# Patient Record
Sex: Male | Born: 1990 | Race: White | Hispanic: No | Marital: Single | Smoking: Current every day smoker
Health system: Southern US, Community
[De-identification: ages and names within clinical notes are randomized; demographics above are authoritative.]

## PROBLEM LIST (undated history)

## (undated) ENCOUNTER — Emergency Department (HOSPITAL_COMMUNITY): Admission: EM | Payer: No Typology Code available for payment source | Source: Home / Self Care

---
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
## 2011-07-07 ENCOUNTER — Emergency Department (HOSPITAL_COMMUNITY): Payer: Medicaid Other

## 2011-07-07 ENCOUNTER — Emergency Department (HOSPITAL_COMMUNITY)
Admission: EM | Admit: 2011-07-07 | Discharge: 2011-07-07 | Disposition: A | Discharge status: Home / Self Care | Payer: Medicaid Other | Attending: Emergency Medicine | Admitting: Emergency Medicine

## 2011-07-07 ENCOUNTER — Encounter (HOSPITAL_COMMUNITY): Payer: Self-pay | Admitting: *Deleted

## 2011-07-07 DIAGNOSIS — S6710XA Crushing injury of unspecified finger(s), initial encounter: Secondary | ICD-10-CM | POA: Insufficient documentation

## 2011-07-07 DIAGNOSIS — J45909 Unspecified asthma, uncomplicated: Secondary | ICD-10-CM | POA: Insufficient documentation

## 2011-07-07 DIAGNOSIS — T07XXXA Unspecified multiple injuries, initial encounter: Secondary | ICD-10-CM

## 2011-07-07 DIAGNOSIS — Y9289 Other specified places as the place of occurrence of the external cause: Secondary | ICD-10-CM | POA: Insufficient documentation

## 2011-07-07 DIAGNOSIS — W230XXA Caught, crushed, jammed, or pinched between moving objects, initial encounter: Secondary | ICD-10-CM | POA: Insufficient documentation

## 2011-07-07 MED ORDER — TETANUS-DIPHTH-ACELL PERTUSSIS 5-2.5-18.5 LF-MCG/0.5 IM SUSP: 0.5000 mL | Freq: Once | INTRAMUSCULAR | Status: DC

## 2011-07-07 MED ORDER — IBUPROFEN 800 MG PO TABS: 800.0000 mg | ORAL_TABLET | Freq: Three times a day (TID) | ORAL | Status: AC

## 2011-07-07 MED ORDER — IBUPROFEN 800 MG PO TABS
800.0000 mg | ORAL_TABLET | Freq: Once | ORAL | Status: AC
  Administered 2011-07-07: 800 mg via ORAL
  Filled 2011-07-07: qty 1

## 2011-07-07 MED ORDER — DOXYCYCLINE HYCLATE 100 MG PO TABS
100.0000 mg | ORAL_TABLET | Freq: Once | ORAL | Status: AC
  Administered 2011-07-07: 100 mg via ORAL
  Filled 2011-07-07: qty 1

## 2011-07-07 MED ORDER — DOXYCYCLINE HYCLATE 100 MG PO CAPS: 100.0000 mg | ORAL_CAPSULE | Freq: Two times a day (BID) | ORAL | Status: AC

## 2011-07-07 MED ORDER — TETANUS-DIPHTHERIA TOXOIDS TD 5-2 LFU IM INJ
0.5000 mL | INJECTION | Freq: Once | INTRAMUSCULAR | Status: AC
  Administered 2011-07-07: 0.5 mL via INTRAMUSCULAR
  Filled 2011-07-07: qty 0.5
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
## 2011-07-07 NOTE — ED Notes (Addendum)
Pt smashed his pointer, middle, ring, and pinky fingers on his right hand yesterday.

## 2011-07-07 NOTE — ED Provider Notes (Signed)
History/physical exam/procedure(s) were performed by non-physician practitioner and as supervising physician I was immediately available for consultation/collaboration. I have reviewed all notes and am in agreement with care and plan.   Hilario Quarry, MD 07/07/11 [phone]

## 2012-09-07 ENCOUNTER — Emergency Department (HOSPITAL_COMMUNITY): Payer: Medicaid Other

## 2012-09-07 ENCOUNTER — Encounter (HOSPITAL_COMMUNITY): Payer: Self-pay | Admitting: Emergency Medicine

## 2012-09-07 ENCOUNTER — Emergency Department (HOSPITAL_COMMUNITY)
Admission: EM | Admit: 2012-09-07 | Discharge: 2012-09-07 | Disposition: A | Discharge status: Home / Self Care | Payer: Medicaid Other | Attending: Emergency Medicine | Admitting: Emergency Medicine

## 2012-09-07 DIAGNOSIS — R109 Unspecified abdominal pain: Secondary | ICD-10-CM | POA: Insufficient documentation

## 2012-09-07 DIAGNOSIS — F17200 Nicotine dependence, unspecified, uncomplicated: Secondary | ICD-10-CM | POA: Insufficient documentation

## 2012-09-07 DIAGNOSIS — N23 Unspecified renal colic: Secondary | ICD-10-CM

## 2012-09-07 DIAGNOSIS — J45909 Unspecified asthma, uncomplicated: Secondary | ICD-10-CM | POA: Insufficient documentation

## 2012-09-07 LAB — URINALYSIS, MICROSCOPIC ONLY
Glucose, UA: NEGATIVE mg/dL
Ketones, ur: NEGATIVE mg/dL
Leukocytes, UA: NEGATIVE
Specific Gravity, Urine: 1.027 (ref 1.005–1.030)
pH: 5.5 (ref 5.0–8.0)

## 2012-09-07 LAB — CBC WITH DIFFERENTIAL/PLATELET
Eosinophils Relative: 1 % (ref 0–5)
HCT: 40.8 % (ref 39.0–52.0)
Hemoglobin: 14.7 g/dL (ref 13.0–17.0)
Lymphocytes Relative: 42 % (ref 12–46)
Lymphs Abs: 4.3 10*3/uL — ABNORMAL HIGH (ref 0.7–4.0)
MCV: 85.5 fL (ref 78.0–100.0)
Monocytes Absolute: 0.8 10*3/uL (ref 0.1–1.0)
RBC: 4.77 MIL/uL (ref 4.22–5.81)
WBC: 10.4 10*3/uL (ref 4.0–10.5)

## 2012-09-07 LAB — COMPREHENSIVE METABOLIC PANEL
Albumin: 4.5 g/dL (ref 3.5–5.2)
BUN: 13 mg/dL (ref 6–23)
Calcium: 9.6 mg/dL (ref 8.4–10.5)
Creatinine, Ser: 1.1 mg/dL (ref 0.50–1.35)
Potassium: 4 mEq/L (ref 3.5–5.1)
Total Protein: 8 g/dL (ref 6.0–8.3)

## 2012-09-07 LAB — LIPASE, BLOOD: Lipase: 27 U/L (ref 11–59)

## 2012-09-07 MED ORDER — ONDANSETRON HCL 4 MG/2ML IJ SOLN
4.0000 mg | Freq: Once | INTRAMUSCULAR | Status: AC
  Administered 2012-09-07: 4 mg via INTRAVENOUS
  Filled 2012-09-07: qty 2

## 2012-09-07 MED ORDER — MORPHINE SULFATE 4 MG/ML IJ SOLN
4.0000 mg | Freq: Once | INTRAMUSCULAR | Status: AC
  Administered 2012-09-07: 4 mg via INTRAVENOUS
  Filled 2012-09-07: qty 1

## 2012-09-07 MED ORDER — HYDROCODONE-ACETAMINOPHEN 5-325 MG PO TABS
2.0000 | ORAL_TABLET | Freq: Once | ORAL | Status: AC
  Administered 2012-09-07: 2 via ORAL
  Filled 2012-09-07: qty 2

## 2012-09-07 MED ORDER — IBUPROFEN 800 MG PO TABS: 800.0000 mg | ORAL_TABLET | Freq: Three times a day (TID) | ORAL | Status: DC

## 2012-09-07 MED ORDER — ONDANSETRON HCL 4 MG PO TABS: 4.0000 mg | ORAL_TABLET | Freq: Four times a day (QID) | ORAL | Status: DC

## 2012-09-07 MED ORDER — ONDANSETRON 4 MG PO TBDP
4.0000 mg | ORAL_TABLET | Freq: Once | ORAL | Status: AC
  Administered 2012-09-07: 4 mg via ORAL
  Filled 2012-09-07: qty 1

## 2012-09-07 MED ORDER — SODIUM CHLORIDE 0.9 % IV BOLUS (SEPSIS)
1000.0000 mL | Freq: Once | INTRAVENOUS | Status: AC
  Administered 2012-09-07: 1000 mL via INTRAVENOUS

## 2012-09-07 MED ORDER — OXYCODONE-ACETAMINOPHEN 5-325 MG PO TABS: 2.0000 | ORAL_TABLET | ORAL | Status: DC | PRN

## 2012-09-07 NOTE — ED Provider Notes (Signed)
History     CSN: [id number]  Arrival date & time 09/07/12  1442   First MD Initiated Contact with Patient 09/07/12 1459      Chief Complaint  Patient presents with  . Flank Pain    (Consider location/radiation/quality/duration/timing/severity/associated sxs/prior treatment) HPI Comments: Patient complains of right lower quadrant pain and right flank pain that has been constant for the past 3 days. Wife states had this pain intermittently for several months it comes and goes. His been constant and worse with palpation and movement. Denies any nausea, vomiting, change in bowel or bladder habits. No dysuria no hematuria, no testicular pain. No history of kidney stones. Denies any back, chest or abdominal pain. Pain is worse with lifting.  The history is provided by the patient.    Past Medical History  Diagnosis Date  . Asthma     History reviewed. No pertinent past surgical history.  History reviewed. No pertinent family history.  History  Substance Use Topics  . Smoking status: Current Every Day Smoker  . Smokeless tobacco: Not on file  . Alcohol Use: No      Review of Systems  Constitutional: Negative for fever, activity change and appetite change.  HENT: Negative for congestion and rhinorrhea.   Respiratory: Negative for chest tightness.   Cardiovascular: Negative for chest pain.  Gastrointestinal: Positive for abdominal pain. Negative for nausea and vomiting.  Genitourinary: Positive for flank pain. Negative for testicular pain.  Musculoskeletal: Negative for back pain.  Skin: Negative for rash.  Neurological: Negative for dizziness and headaches.  A complete 10 system review of systems was obtained and all systems are negative except as noted in the HPI and PMH.    Allergies  Amoxicillin and Penicillins  Home Medications  No current outpatient prescriptions on file.  BP 140/71  Pulse 116  Temp(Src) 98 F (36.7 C) (Oral)  Resp 16  SpO2 100%  Physical  Exam  Constitutional: He is oriented to person, place, and time. He appears well-developed and well-nourished. No distress.  HENT:  Head: Normocephalic and atraumatic.  Mouth/Throat: Oropharynx is clear and moist. No oropharyngeal exudate.  Eyes: Conjunctivae and EOM are normal. Pupils are equal, round, and reactive to light.  Neck: Normal range of motion. Neck supple.  Cardiovascular: Normal rate, regular rhythm and normal heart sounds.   Pulmonary/Chest: Effort normal and breath sounds normal. No respiratory distress.  Abdominal: Soft. There is tenderness. There is no rebound and no guarding.  TTP RLQ with guarding. No rebound.  Genitourinary:  No testicular pain  Musculoskeletal: Normal range of motion. He exhibits no edema and no tenderness.  No CVAT  Neurological: He is alert and oriented to person, place, and time. No cranial nerve deficit. He exhibits normal muscle tone. Coordination normal.  Skin: Skin is warm.    ED Course  Procedures (including critical care time)  Labs Reviewed  CBC WITH DIFFERENTIAL - Abnormal; Notable for the following:    Lymphs Abs 4.3 (*)    All other components within normal limits  URINALYSIS, MICROSCOPIC ONLY - Abnormal; Notable for the following:    Hgb urine dipstick MODERATE (*)    Bilirubin Urine SMALL (*)    All other components within normal limits  COMPREHENSIVE METABOLIC PANEL  LIPASE, BLOOD   Ct Abdomen Pelvis Wo Contrast  09/07/2012  *RADIOLOGY REPORT*  Clinical Data: Right flank pain for 2 days  CT ABDOMEN AND PELVIS WITHOUT CONTRAST  Technique:  Multidetector CT imaging of the abdomen and pelvis  was performed following the standard protocol without intravenous contrast.  Comparison: None.  Findings: The lung bases are clear.  The liver is unremarkable in the unenhanced state.  No calcified gallstones are seen although there may be sludge layering within the gallbladder.  The pancreas is normal in size and the pancreatic duct is not  dilated.  The adrenal glands and spleen are unremarkable.  The stomach is decompressed.  There are several small right renal calculi present. However no hydronephrosis is seen.  The abdominal aorta is normal in caliber.  No adenopathy is noted.  The distal ureters are normal in caliber and no distal ureteral calculus is noted at.  The urinary bladder is not well distended but no abnormality is seen.  The prostate is normal in size.  The appendix and the terminal ileum are unremarkable.  No bony abnormality is seen.  IMPRESSION:  1.  No present hydronephrosis. 2.  Several small right renal calculi which are nonobstructing.   Original Report Authenticated By: Dwyane Dee, M.D.      No diagnosis found.    MDM  Right lower quadrant pain for the past 3 days it is constant. No associated symptoms.  Concern for appendicitis versus kidney stone.  Hematuria and UA without infection.  CT scan shows several renal stones but no obstruction or hydronephrosis. Normal appendix. Question recently passed stone.  Pain improved medications. We'll treat for renal colic. Followup with urology. Return precautions discussed.      Glynn Octave, MD 09/07/12 1840

## 2012-09-07 NOTE — ED Notes (Signed)
Pt c/o right flank pain x 2 days; pt denies other sx

## 2013-01-23 ENCOUNTER — Encounter (HOSPITAL_COMMUNITY): Payer: Self-pay | Admitting: Emergency Medicine

## 2013-01-23 ENCOUNTER — Emergency Department (HOSPITAL_COMMUNITY)
Admission: EM | Admit: 2013-01-23 | Discharge: 2013-01-23 | Disposition: A | Discharge status: Home / Self Care | Payer: Medicaid Other | Attending: Emergency Medicine | Admitting: Emergency Medicine

## 2013-01-23 ENCOUNTER — Emergency Department (HOSPITAL_COMMUNITY): Payer: Medicaid Other

## 2013-01-23 DIAGNOSIS — R630 Anorexia: Secondary | ICD-10-CM | POA: Insufficient documentation

## 2013-01-23 DIAGNOSIS — Z880 Allergy status to penicillin: Secondary | ICD-10-CM | POA: Insufficient documentation

## 2013-01-23 DIAGNOSIS — R319 Hematuria, unspecified: Secondary | ICD-10-CM | POA: Insufficient documentation

## 2013-01-23 DIAGNOSIS — R300 Dysuria: Secondary | ICD-10-CM | POA: Insufficient documentation

## 2013-01-23 DIAGNOSIS — R109 Unspecified abdominal pain: Secondary | ICD-10-CM | POA: Insufficient documentation

## 2013-01-23 DIAGNOSIS — F17200 Nicotine dependence, unspecified, uncomplicated: Secondary | ICD-10-CM | POA: Insufficient documentation

## 2013-01-23 DIAGNOSIS — R51 Headache: Secondary | ICD-10-CM | POA: Insufficient documentation

## 2013-01-23 DIAGNOSIS — R110 Nausea: Secondary | ICD-10-CM | POA: Insufficient documentation

## 2013-01-23 DIAGNOSIS — J45909 Unspecified asthma, uncomplicated: Secondary | ICD-10-CM | POA: Insufficient documentation

## 2013-01-23 DIAGNOSIS — K089 Disorder of teeth and supporting structures, unspecified: Secondary | ICD-10-CM | POA: Insufficient documentation

## 2013-01-23 LAB — CBC WITH DIFFERENTIAL/PLATELET
Basophils Absolute: 0 10*3/uL (ref 0.0–0.1)
Basophils Relative: 0 % (ref 0–1)
Eosinophils Absolute: 0.1 10*3/uL (ref 0.0–0.7)
Eosinophils Relative: 1 % (ref 0–5)
HCT: 41.2 % (ref 39.0–52.0)
Hemoglobin: 14.7 g/dL (ref 13.0–17.0)
MCH: 31.5 pg (ref 26.0–34.0)
MCHC: 35.7 g/dL (ref 30.0–36.0)
MCV: 88.4 fL (ref 78.0–100.0)
Monocytes Absolute: 1.3 10*3/uL — ABNORMAL HIGH (ref 0.1–1.0)
Monocytes Relative: 9 % (ref 3–12)
Neutro Abs: 10 10*3/uL — ABNORMAL HIGH (ref 1.7–7.7)
RDW: 12.6 % (ref 11.5–15.5)

## 2013-01-23 LAB — POCT I-STAT, CHEM 8
BUN: 8 mg/dL (ref 6–23)
Calcium, Ion: 1.18 mmol/L (ref 1.12–1.23)
Chloride: 106 mEq/L (ref 96–112)
Creatinine, Ser: 1.2 mg/dL (ref 0.50–1.35)
Sodium: 143 mEq/L (ref 135–145)

## 2013-01-23 LAB — URINALYSIS, ROUTINE W REFLEX MICROSCOPIC
Bilirubin Urine: NEGATIVE
Glucose, UA: NEGATIVE mg/dL
Ketones, ur: NEGATIVE mg/dL
Leukocytes, UA: NEGATIVE
Protein, ur: NEGATIVE mg/dL

## 2013-01-23 MED ORDER — HYDROMORPHONE HCL PF 1 MG/ML IJ SOLN
1.0000 mg | Freq: Once | INTRAMUSCULAR | Status: AC
  Administered 2013-01-23: 1 mg via INTRAMUSCULAR
  Filled 2013-01-23: qty 1

## 2013-01-23 MED ORDER — ONDANSETRON HCL 4 MG/2ML IJ SOLN
4.0000 mg | Freq: Once | INTRAMUSCULAR | Status: AC
  Administered 2013-01-23: 4 mg via INTRAVENOUS
  Filled 2013-01-23: qty 2

## 2013-01-23 MED ORDER — HYDROCODONE-ACETAMINOPHEN 5-325 MG PO TABS: 1.0000 | ORAL_TABLET | Freq: Four times a day (QID) | ORAL | Status: DC | PRN

## 2013-01-23 MED ORDER — IOHEXOL 300 MG/ML  SOLN
25.0000 mL | INTRAMUSCULAR | Status: DC | PRN
  Administered 2013-01-23: 25 mL via ORAL

## 2013-01-23 MED ORDER — KETOROLAC TROMETHAMINE 30 MG/ML IJ SOLN
30.0000 mg | Freq: Once | INTRAMUSCULAR | Status: AC
  Administered 2013-01-23: 30 mg via INTRAVENOUS
  Filled 2013-01-23: qty 1

## 2013-01-23 MED ORDER — IOHEXOL 300 MG/ML  SOLN
100.0000 mL | Freq: Once | INTRAMUSCULAR | Status: AC | PRN
  Administered 2013-01-23: 100 mL via INTRAVENOUS

## 2013-01-23 NOTE — ED Notes (Signed)
Patient resting in bed, talking on phone to family member. Call bell within reach.  Wife at bedside. Patient denies any other needs as this time. Vital signs stable. Will continue to monitor patient.

## 2013-01-23 NOTE — ED Notes (Signed)
Patient is resting comfortably in bed watching TV. Wife at bedside attentive to patient.  Patient denies any needs at this time. Vital signs stable. Will continue to monitor.

## 2013-01-23 NOTE — ED Notes (Signed)
Patient discharge instruction discussed. Medications reviewed. Discussed follow-up care and given resources. Vital signs stable. Patient denies any other questions. Discharged to home with wife.

## 2013-01-23 NOTE — ED Notes (Signed)
Patient transported to X-ray 

## 2013-01-23 NOTE — ED Provider Notes (Signed)
15:00:  Care of pt assumed from [place], [place], see her note for full history and details of initial workup.  Briefly, pt is a 22 y.o. M presenting with 2 weeks of R flank pain and nausea.  Pt was seen in ED when symptoms began where he had CT stone study showing stones in kidney, none obstructing.  Pt has had continued pain so returned today.  He has mild leukocytosis, otherwise normal labs, U/A, and CXR.  CT A/P pending to r/o appendicitis.  Plan for d/c home if normal.  CT A/P negative for appendicitis or other acute intra-abdominal pathology.  Non-obstructing R renal calculi noted.   Discussed results with pt.  Advised close f/u with PCP if pain persists.  ED return precautions given including worsening pain, fever, vomiting, or any other concerns.  Pt states understanding, stable at discharge.  Discussed with attending Dr. Micheline Maze.   CT Abdomen Pelvis W Contrast (Final result)  Result time: 01/23/13 17:39:35    Final result by Rad Results In Interface (01/23/13 17:39:35)    Narrative:   *RADIOLOGY REPORT*  Clinical Data: Right lower quadrant pain question appendicitis  CT ABDOMEN AND PELVIS WITH CONTRAST  Technique: Multidetector CT imaging of the abdomen and pelvis was performed following the standard protocol during bolus administration of intravenous contrast. Sagittal and coronal MPR images reconstructed from axial data set.  Contrast: OMNIPAQUE IOHEXOL 300 MG/ML SOLN Dilute oral contrast.  Comparison: 09/07/2012  Findings: Lung bases clear. 2 tiny nonobstructing right renal calculi. No hydronephrosis ureteral dilatation. Liver, spleen, pancreas, kidneys, and adrenal glands otherwise normal appearance. Normal appendix.  Bladder and ureters unremarkable. Stomach and bowel loops normal appearance. No mass, adenopathy, free fluid or inflammatory process. Bones unremarkable.  IMPRESSION: Nonobstructing right renal calculi. No acute intra-abdominal or intrapelvic  abnormalities. Specifically no evidence of acute appendicitis.     Jodean Lima, MD 01/24/13 [phone]

## 2013-01-23 NOTE — ED Notes (Signed)
Pt reports right flank pain radiates to right back ongoing for months. Pt reports dark urine that is red in color. Pt also c/o lower mouth pain and headache onset today. Pt reports nausea.

## 2013-01-24 NOTE — ED Provider Notes (Signed)
Medical screening examination/treatment/procedure(s) were conducted as a shared visit with resident-physician practitioner(s) and myself.  I personally evaluated the patient during the encounter.  Pt is a 22 y.o. male with pmhx as above presenting with 2 weeks R flank pain, nausea.  Pt found to have non-obstructing stones in R kidney, no other intraabdominal pathology.  Pt safe to f/u as outpt.     Shanna Cisco, MD 01/24/13 [phone]

## 2013-02-19 NOTE — ED Provider Notes (Signed)
CSN: [id number]     Arrival date & time 01/23/13  1205 History   First MD Initiated Contact with Patient 01/23/13 1218     Chief Complaint  Patient presents with  . Abdominal Pain  . Dental Pain   (Consider location/radiation/quality/duration/timing/severity/associated sxs/prior Treatment) HPI Comments: Patient is 22 year old male who presents to the Ed with complaints of right flank pain, nausea and headache over the past 24 hours - reports this is worsening as the time goes on.  Reports decrease in appetite and has noticed dark red urine as well.  Denies vomiting, constipation, diarrhea, fever or chills.  Patient is a 22 y.o. male presenting with abdominal pain and tooth pain. The history is provided by the patient. No language interpreter was used.  Abdominal Pain Pain location:  R flank Pain quality: dull and gnawing   Pain quality: not aching, not burning, no pressure, not sharp, not shooting, not stabbing and not throbbing   Pain radiates to:  Does not radiate Pain severity:  Moderate Onset quality:  Gradual Duration:  24 hours Timing:  Constant Progression:  Worsening Chronicity:  New Context: not diet changes, not eating, not medication withdrawal, not retching and not sick contacts   Relieved by:  Nothing Worsened by:  Nothing tried Ineffective treatments:  None tried Associated symptoms: anorexia, dysuria, hematuria and nausea   Associated symptoms: no chest pain, no cough, no diarrhea, no fever, no melena and no vomiting   Dental Pain Associated symptoms: no fever     Past Medical History  Diagnosis Date  . Asthma    History reviewed. No pertinent past surgical history. No family history on file. History  Substance Use Topics  . Smoking status: Current Every Day Smoker  . Smokeless tobacco: Not on file  . Alcohol Use: No    Review of Systems  Constitutional: Negative for fever.  Respiratory: Negative for cough.   Cardiovascular: Negative for chest pain.   Gastrointestinal: Positive for nausea, abdominal pain and anorexia. Negative for vomiting, diarrhea and melena.  Genitourinary: Positive for dysuria and hematuria.  All other systems reviewed and are negative.    Allergies  Amoxicillin and Penicillins  Home Medications   Current Outpatient Rx  Name  Route  Sig  Dispense  Refill  . ibuprofen (ADVIL,MOTRIN) 800 MG tablet   Oral   Take 1 tablet (800 mg total) by mouth 3 (three) times daily.   21 tablet   0   . HYDROcodone-acetaminophen (NORCO/VICODIN) 5-325 MG per tablet   Oral   Take 1 tablet by mouth every 6 (six) hours as needed for pain.   10 tablet   0    BP 124/63  Pulse 64  Temp(Src) 98.1 F (36.7 C) (Oral)  Resp 16  Ht 6' (1.829 m)  Wt 180 lb (81.647 kg)  BMI 24.41 kg/m2  SpO2 100% Physical Exam  Nursing note and vitals reviewed. Constitutional: He is oriented to person, place, and time. He appears well-developed and well-nourished. No distress.  HENT:  Head: Normocephalic and atraumatic.  Right Ear: External ear normal.  Left Ear: External ear normal.  Nose: Nose normal.  Mouth/Throat: Oropharynx is clear and moist. No oropharyngeal exudate.  Eyes: Conjunctivae are normal. Pupils are equal, round, and reactive to light. No scleral icterus.  Neck: Normal range of motion. Neck supple.  Cardiovascular: Normal rate, regular rhythm and normal heart sounds.  Exam reveals no gallop and no friction rub.   No murmur heard. Pulmonary/Chest: Effort normal  and breath sounds normal. No respiratory distress. He has no wheezes. He has no rales. He exhibits no tenderness.  Abdominal: Soft. Bowel sounds are normal. He exhibits no distension. There is no tenderness. There is CVA tenderness. There is no rebound and no guarding.  Genitourinary: Penis normal. Right testis shows no mass and no tenderness. Left testis shows no mass and no tenderness. Circumcised.  Musculoskeletal: Normal range of motion. He exhibits no edema and  no tenderness.  Neurological: He is alert and oriented to person, place, and time. No cranial nerve deficit. He exhibits normal muscle tone. Coordination normal.  Skin: Skin is warm and dry. No rash noted. No erythema. No pallor.  Psychiatric: He has a normal mood and affect. His behavior is normal. Judgment and thought content normal.    ED Course  Procedures (including critical care time) Labs Review Labs Reviewed  CBC WITH DIFFERENTIAL - Abnormal; Notable for the following:    WBC 13.9 (*)    Neutro Abs 10.0 (*)    Monocytes Absolute 1.3 (*)    All other components within normal limits  URINALYSIS, ROUTINE W REFLEX MICROSCOPIC  POCT I-STAT, CHEM 8   Imaging Review No results found.  Results for orders placed during the hospital encounter of 01/23/13  URINALYSIS, ROUTINE W REFLEX MICROSCOPIC      Result Value Range   Color, Urine YELLOW  YELLOW   APPearance CLEAR  CLEAR   Specific Gravity, Urine 1.022  1.005 - 1.030   pH 6.5  5.0 - 8.0   Glucose, UA NEGATIVE  NEGATIVE mg/dL   Hgb urine dipstick NEGATIVE  NEGATIVE   Bilirubin Urine NEGATIVE  NEGATIVE   Ketones, ur NEGATIVE  NEGATIVE mg/dL   Protein, ur NEGATIVE  NEGATIVE mg/dL   Urobilinogen, UA 1.0  0.0 - 1.0 mg/dL   Nitrite NEGATIVE  NEGATIVE   Leukocytes, UA NEGATIVE  NEGATIVE  CBC WITH DIFFERENTIAL      Result Value Range   WBC 13.9 (*) 4.0 - 10.5 K/uL   RBC 4.66  4.22 - 5.81 MIL/uL   Hemoglobin 14.7  13.0 - 17.0 g/dL   HCT 16.1  09.6 - 04.5 %   MCV 88.4  78.0 - 100.0 fL   MCH 31.5  26.0 - 34.0 pg   MCHC 35.7  30.0 - 36.0 g/dL   RDW 40.9  81.1 - 91.4 %   Platelets 258  150 - 400 K/uL   Neutrophils Relative % 73  43 - 77 %   Neutro Abs 10.0 (*) 1.7 - 7.7 K/uL   Lymphocytes Relative 18  12 - 46 %   Lymphs Abs 2.4  0.7 - 4.0 K/uL   Monocytes Relative 9  3 - 12 %   Monocytes Absolute 1.3 (*) 0.1 - 1.0 K/uL   Eosinophils Relative 1  0 - 5 %   Eosinophils Absolute 0.1  0.0 - 0.7 K/uL   Basophils Relative 0  0 - 1  %   Basophils Absolute 0.0  0.0 - 0.1 K/uL  POCT I-STAT, CHEM 8      Result Value Range   Sodium 143  135 - 145 mEq/L   Potassium 3.9  3.5 - 5.1 mEq/L   Chloride 106  96 - 112 mEq/L   BUN 8  6 - 23 mg/dL   Creatinine, Ser 7.82  0.50 - 1.35 mg/dL   Glucose, Bld 94  70 - 99 mg/dL   Calcium, Ion 9.56  2.13 - 1.23 mmol/L  TCO2 26  0 - 100 mmol/L   Hemoglobin 15.6  13.0 - 17.0 g/dL   HCT 16.1  09.6 - 04.5 %   Dg Chest 2 View  01/23/2013   *RADIOLOGY REPORT*  Clinical Data: Abdominal pain.  Dental pain.  CHEST - 2 VIEW  Comparison: Two-view chest x-ray 07/27/2007.  Findings: Cardiomediastinal silhouette unremarkable, unchanged. Lungs clear.  Bronchovascular markings normal.  Pulmonary vascularity normal.  No pneumothorax.  No pleural effusions. Interval resolution of the opacities in the right middle and lower lobes identified on the prior examination.  Visualized bony thorax intact.  IMPRESSION: Normal examination.   Original Report Authenticated By: Hulan Saas, M.D.   Ct Abdomen Pelvis W Contrast  01/23/2013   *RADIOLOGY REPORT*  Clinical Data: Right lower quadrant pain question appendicitis  CT ABDOMEN AND PELVIS WITH CONTRAST  Technique:  Multidetector CT imaging of the abdomen and pelvis was performed following the standard protocol during bolus administration of intravenous contrast. Sagittal and coronal MPR images reconstructed from axial data set.  Contrast: OMNIPAQUE IOHEXOL 300 MG/ML  SOLN Dilute oral contrast.  Comparison: 09/07/2012  Findings: Lung bases clear. 2 tiny nonobstructing right renal calculi. No hydronephrosis ureteral dilatation. Liver, spleen, pancreas, kidneys, and adrenal glands otherwise normal appearance. Normal appendix.  Bladder and ureters unremarkable. Stomach and bowel loops normal appearance. No mass, adenopathy, free fluid or inflammatory process. Bones unremarkable.  IMPRESSION: Nonobstructing right renal calculi. No acute intra-abdominal or intrapelvic  abnormalities. Specifically no evidence of acute appendicitis.   Original Report Authenticated By: Ulyses Southward, M.D.      MDM   1. Abdominal  pain, other specified site    Patient here with right flank/lower abdominal pain and nausea - plan to get CT Scan to rule out appendicitis.  Though he does have urinary symptoms, I doubt kidney stone at this time.  Patient turned over to the afternoon resident.  Please see her notes for disposition and follow up.    Izola Price Marisue [place], [place] 02/19/13 [phone]

## 2013-02-19 NOTE — ED Provider Notes (Signed)
Medical screening examination/treatment/procedure(s) were performed by non-physician practitioner and as supervising physician I was immediately available for consultation/collaboration.    Gilda Crease, MD 02/19/13 [phone]

## 2013-07-08 ENCOUNTER — Emergency Department (HOSPITAL_COMMUNITY): Payer: No Typology Code available for payment source

## 2013-07-08 ENCOUNTER — Encounter (HOSPITAL_COMMUNITY): Payer: Self-pay | Admitting: Emergency Medicine

## 2013-07-08 ENCOUNTER — Emergency Department (HOSPITAL_COMMUNITY)
Admission: EM | Admit: 2013-07-08 | Discharge: 2013-07-09 | Disposition: A | Discharge status: Home / Self Care | Payer: No Typology Code available for payment source | Attending: Emergency Medicine | Admitting: Emergency Medicine

## 2013-07-08 DIAGNOSIS — S139XXA Sprain of joints and ligaments of unspecified parts of neck, initial encounter: Secondary | ICD-10-CM | POA: Insufficient documentation

## 2013-07-08 DIAGNOSIS — Z880 Allergy status to penicillin: Secondary | ICD-10-CM | POA: Insufficient documentation

## 2013-07-08 DIAGNOSIS — Y9389 Activity, other specified: Secondary | ICD-10-CM | POA: Insufficient documentation

## 2013-07-08 DIAGNOSIS — Y92410 Unspecified street and highway as the place of occurrence of the external cause: Secondary | ICD-10-CM | POA: Insufficient documentation

## 2013-07-08 DIAGNOSIS — F17200 Nicotine dependence, unspecified, uncomplicated: Secondary | ICD-10-CM | POA: Insufficient documentation

## 2013-07-08 DIAGNOSIS — R071 Chest pain on breathing: Secondary | ICD-10-CM | POA: Insufficient documentation

## 2013-07-08 DIAGNOSIS — S161XXA Strain of muscle, fascia and tendon at neck level, initial encounter: Secondary | ICD-10-CM

## 2013-07-08 DIAGNOSIS — J45909 Unspecified asthma, uncomplicated: Secondary | ICD-10-CM | POA: Insufficient documentation

## 2013-07-08 DIAGNOSIS — R079 Chest pain, unspecified: Secondary | ICD-10-CM | POA: Insufficient documentation

## 2013-07-08 DIAGNOSIS — R51 Headache: Secondary | ICD-10-CM | POA: Insufficient documentation

## 2013-07-08 DIAGNOSIS — M25519 Pain in unspecified shoulder: Secondary | ICD-10-CM | POA: Insufficient documentation
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
## 2013-07-08 NOTE — ED Notes (Signed)
Pt currently in radiology. Called radiology, spoke with Francis DowseJoel [facility].
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
## 2013-07-09 MED ORDER — METHOCARBAMOL 500 MG PO TABS: 500.0000 mg | ORAL_TABLET | Freq: Two times a day (BID) | ORAL | Status: AC

## 2013-07-09 MED ORDER — HYDROCODONE-ACETAMINOPHEN 5-325 MG PO TABS: 1.0000 | ORAL_TABLET | ORAL | Status: AC | PRN

## 2013-07-09 NOTE — Discharge Instructions (Signed)
1. Medications: robaxin, vicodin, usual home medications 2. Treatment: rest, drink plenty of fluids, use heat, gentle stretching as discussed 3. Follow Up: Please followup with your primary doctor for discussion of your diagnoses and further evaluation after today's visit; if you do not have a primary care doctor use the resource guide provided to find one;     Cervical Sprain A cervical sprain is an injury in the neck in which the strong, fibrous tissues (ligaments) that connect your neck bones stretch or tear. Cervical sprains can range from mild to severe. Severe cervical sprains can cause the neck vertebrae to be unstable. This can lead to damage of the spinal cord and can result in serious nervous system problems. The amount of time it takes for a cervical sprain to get better depends on the cause and extent of the injury. Most cervical sprains heal in 1 to 3 weeks. CAUSES  Severe cervical sprains may be caused by:   Contact sport injuries (such as from football, rugby, wrestling, hockey, auto racing, gymnastics, diving, martial arts, or boxing).   Motor vehicle collisions.   Whiplash injuries. This is an injury from a sudden forward-and backward whipping movement of the head and neck.  Falls.  Mild cervical sprains may be caused by:   Being in an awkward position, such as while cradling a telephone between your ear and shoulder.   Sitting in a chair that does not offer proper support.   Working at a poorly Marketing executive station.   Looking up or down for long periods of time.  SYMPTOMS   Pain, soreness, stiffness, or a burning sensation in the front, back, or sides of the neck. This discomfort may develop immediately after the injury or slowly, 24 hours or more after the injury.   Pain or tenderness directly in the middle of the back of the neck.   Shoulder or upper back pain.   Limited ability to move the neck.   Headache.   Dizziness.   Weakness,  numbness, or tingling in the hands or arms.   Muscle spasms.   Difficulty swallowing or chewing.   Tenderness and swelling of the neck.  DIAGNOSIS  Most of the time your health care provider can diagnose a cervical sprain by taking your history and doing a physical exam. Your health care provider will ask about previous neck injuries and any known neck problems, such as arthritis in the neck. X-rays may be taken to find out if there are any other problems, such as with the bones of the neck. Other tests, such as a CT scan or MRI, may also be needed.  TREATMENT  Treatment depends on the severity of the cervical sprain. Mild sprains can be treated with rest, keeping the neck in place (immobilization), and pain medicines. Severe cervical sprains are immediately immobilized. Further treatment is done to help with pain, muscle spasms, and other symptoms and may include:  Medicines, such as pain relievers, numbing medicines, or muscle relaxants.   Physical therapy. This may involve stretching exercises, strengthening exercises, and posture training. Exercises and improved posture can help stabilize the neck, strengthen muscles, and help stop symptoms from returning.  HOME CARE INSTRUCTIONS   Put ice on the injured area.   Put ice in a plastic bag.   Place a towel between your skin and the bag.   Leave the ice on for 15 20 minutes, 3 4 times a day.   If your injury was severe, you may have been  given a cervical collar to wear. A cervical collar is a two-piece collar designed to keep your neck from moving while it heals.  Do not remove the collar unless instructed by your health care provider.  If you have long hair, keep it outside of the collar.  Ask your health care provider before making any adjustments to your collar. Minor adjustments may be required over time to improve comfort and reduce pressure on your chin or on the back of your head.  Ifyou are allowed to remove the  collar for cleaning or bathing, follow your health care provider's instructions on how to do so safely.  Keep your collar clean by wiping it with mild soap and water and drying it completely. If the collar you have been given includes removable pads, remove them every 1 2 days and hand wash them with soap and water. Allow them to air dry. They should be completely dry before you wear them in the collar.  If you are allowed to remove the collar for cleaning and bathing, wash and dry the skin of your neck. Check your skin for irritation or sores. If you see any, tell your health care provider.  Do not drive while wearing the collar.   Only take over-the-counter or prescription medicines for pain, discomfort, or fever as directed by your health care provider.   Keep all follow-up appointments as directed by your health care provider.   Keep all physical therapy appointments as directed by your health care provider.   Make any needed adjustments to your workstation to promote good posture.   Avoid positions and activities that make your symptoms worse.   Warm up and stretch before being active to help prevent problems.  SEEK MEDICAL CARE IF:   Your pain is not controlled with medicine.   You are unable to decrease your pain medicine over time as planned.   Your activity level is not improving as expected.  SEEK IMMEDIATE MEDICAL CARE IF:   You develop any bleeding.  You develop stomach upset.  You have signs of an allergic reaction to your medicine.   Your symptoms get worse.   You develop new, unexplained symptoms.   You have numbness, tingling, weakness, or paralysis in any part of your body.  MAKE SURE YOU:   Understand these instructions.  Will watch your condition.  Will get help right away if you are not doing well or get worse. Document Released: 03/17/2007 Document Revised: 03/10/2013 Document Reviewed: 11/25/2012 [facility] Patient Information 2014  [place], [place]. Back Exercises Back exercises help treat and prevent back injuries. The goal of back exercises is to increase the strength of your abdominal and back muscles and the flexibility of your back. These exercises should be started when you no longer have back pain. Back exercises include:  Pelvic Tilt. Lie on your back with your knees bent. Tilt your pelvis until the lower part of your back is against the floor. Hold this position 5 to 10 sec and repeat 5 to 10 times.  Knee to Chest. Pull first 1 knee up against your chest and hold for 20 to 30 seconds, repeat this with the other knee, and then both knees. This may be done with the other leg straight or bent, whichever feels better.  Sit-Ups or Curl-Ups. Bend your knees 90 degrees. Start with tilting your pelvis, and do a partial, slow sit-up, lifting your trunk only 30 to 45 degrees off the floor. Take at least 2 to 3  seconds for each sit-up. Do not do sit-ups with your knees out straight. If partial sit-ups are difficult, simply do the above but with only tightening your abdominal muscles and holding it as directed.  Hip-Lift. Lie on your back with your knees flexed 90 degrees. Push down with your feet and shoulders as you raise your hips a couple inches off the floor; hold for 10 seconds, repeat 5 to 10 times.  Back arches. Lie on your stomach, propping yourself up on bent elbows. Slowly press on your hands, causing an arch in your low back. Repeat 3 to 5 times. Any initial stiffness and discomfort should lessen with repetition over time.  Shoulder-Lifts. Lie face down with arms beside your body. Keep hips and torso pressed to floor as you slowly lift your head and shoulders off the floor. Do not overdo your exercises, especially in the beginning. Exercises may cause you some mild back discomfort which lasts for a few minutes; however, if the pain is more severe, or lasts for more than 15 minutes, do not continue exercises until you see  your caregiver. Improvement with exercise therapy for back problems is slow.  See your caregivers for assistance with developing a proper back exercise program. Document Released: 06/27/2004 Document Revised: 08/12/2011 Document Reviewed: 03/21/2011 [employer] Patient Information 2014 [place], [place].    Emergency Department Resource Guide 1) Find a Doctor and Pay Out of Pocket Although you won't have to find out who is covered by your insurance plan, it is a good idea to ask around and get recommendations. You will then need to call the office and see if the doctor you have chosen will accept you as a new patient and what types of options they offer for patients who are self-pay. Some doctors offer discounts or will set up payment plans for their patients who do not have insurance, but you will need to ask so you aren't surprised when you get to your appointment.  2) Contact Your Local Health Department Not all health departments have doctors that can see patients for sick visits, but many do, so it is worth a call to see if yours does. If you don't know where your local health department is, you can check in your phone book. The CDC also has a tool to help you locate your state's health department, and many state websites also have listings of all of their local health departments.  3) Find a Walk-in Clinic If your illness is not likely to be very severe or complicated, you may want to try a walk in clinic. These are popping up all over the country in pharmacies, drugstores, and shopping centers. They're usually staffed by nurse practitioners or physician assistants that have been trained to treat common illnesses and complaints. They're usually fairly quick and inexpensive. However, if you have serious medical issues or chronic medical problems, these are probably not your best option.  No Primary Care Doctor: - Call Health Connect at  [phone] - they can help you locate a primary care doctor that   accepts your insurance, provides certain services, etc. - Physician Referral Service- [phone]  Chronic Pain Problems: Organization         Address  Phone   Notes  [facility]  [phone] Patients need to be referred by their primary care doctor.   Medication Assistance: Organization         Address  Phone   Notes  [facility] Medication Assistance Program [phone]  [address] [place], [place] [address] [phone] --Must be a resident of [facility] -- Must have NO insurance coverage whatsoever (no Medicaid/ Medicare, etc.) -- The pt. MUST have a primary care doctor that directs their care regularly and follows them in the community   [facility]ist  [phone]   [employer]  [phone]    Agencies that provide inexpensive medical care: Organization         Address  Phone   Notes  [facility]  [phone]   [facility]    [phone]   [facility] [address] [place], [place] [address] [phone]   [facility] [address] [place] [phone]   Planned Parenthood    [phone]   [facility]    [phone]   [facility]  [address], [place] Phone:  [phone], Fax:  [phone] Hours of Operation:  9 am - 6 pm, M-F.  Also accepts Medicaid/Medicare and self-pay.  [facility]  [address], [place] Phone: [phone], Fax: [phone]. Hours of Operation:  8:30 am - 5:30 pm, M-F.  Also accepts Medicaid and self-pay.  [facility] [place] [address], [place] Phone: [phone]   [facility] [address] Natasha Bence [place], [place] [phone], Ext. [phone] Mondays & Thursdays: 7-9 AM.  First 15 patients are seen on a first come, first serve basis.    Medicaid-accepting [facility] Providers:  Organization          Address  Phone   Notes  [facility] [address], [place] [phone] Also accepts self-pay patients.  [facility] [address] Laurell Josephs [place], [place]  [phone]   [facility] [address], [place] [phone]   [facility] [address], [place] [phone]   Renaye Rakers [address], [place]   [phone] Only accepts [place] patients after they have their name applied to their card.   Self-Pay (no insurance) in [facility]:  Organization         Address  Phone   Notes  Sickle Cell Patients, [facility] [address] [place], [place] [phone]   [facility] [address] [place], [place] [phone]   [facility]  [address],  [phone]   [facility]/Dr. Osei-Bonsu  [address] [place] or [address], [place] [phone] Phone number for both [place] and [place] locations is the same.  [facility] [address] [place] [phone]   [facility] [address] [place] or [address] [phone]   [facility] [address] [place] [phone], phone; [phone], fax Sees patients 1st and 3rd Saturday of every month.  Must not qualify for public or private insurance (i.e. Medicaid, Medicare, [place] Health Choice, Veterans' Benefits)  Household income should be no more than 200% of the poverty level The clinic cannot treat you if you are pregnant or think you are pregnant  Sexually transmitted diseases are not treated at the clinic.  Dental Care: Organization         Address  Phone  Notes  [facility] [address] [place],  [place] [phone] Accepts children up to age 78 who are enrolled in [place] or [place] Health Choice; pregnant women with a Medicaid card; and children who have applied for Medicaid or [place] Health Choice, but were declined, whose parents can pay a reduced fee at time of service.  [facility]  [address], [place] [phone] Accepts children up to age 63 who are enrolled in [place] or [place] Health Choice; pregnant women with a Medicaid card; and children who have applied for Medicaid or [place] Health Choice, but were declined, whose parents can pay a reduced fee at time of service.  [facility]  [address] [place], [place] [phone] Patients are seen by appointment only. Walk-ins are not accepted. [facility] will see patients 59 years of age and older. Monday - Tuesday (8am-5pm) Most Wednesdays (8:30-5pm) $30 per visit, cash only  [facility] Adult Dental Access [facility]  [address], [facility] [phone] Patients are seen by appointment only. Walk-ins are not accepted. [facility] will see patients 6 years of age and older. One Wednesday Evening (Monthly: Volunteer Based).  $30 per visit, cash only  [employer]  [phone] for adults; Children under age 7, call Graduate Pediatric Dentistry at [phone]. Children aged 69-14, please call [phone] to request a pediatric application.  Dental services are provided in all areas of dental care including fillings, crowns and bridges, complete and partial dentures, implants, gum treatment, root canals, and extractions. Preventive care is also provided. Treatment is provided to both adults and children. Patients are selected via a lottery and there is often a waiting list.   [facility] [address], [place]  [phone] [email or web address]   [facility] [address] [place], [place]  [phone], Ext. [phone] Second and Fourth Thursday of each month, opens at 6:30 AM; Clinic ends at 9 AM.  Patients are seen on a first-come first-served basis, and a limited number are seen during each clinic.   [facility]  [address] Ether Griffins [place], [place] [phone]   Eligibility Requirements You must have lived in [place], [place], or [place] counties for at least the last three months.   You cannot be eligible for state or federal sponsored [place], including [employer], [place], or [employer].   You generally cannot be eligible for healthcare insurance through your employer.    How to apply: Eligibility screenings are held every Tuesday and Wednesday afternoon from 1:00 pm until 4:00 pm. You do not need an appointment for the interview!  [facility] [address] [place], [place] [phone]   [facility] Health Department  [phone]   [facility] Health Department  [phone]   [facility] Health Department  [phone]    Behavioral Health Resources in the Community: Intensive Outpatient Programs Organization         Address  Phone  Notes  [facility] [address] [place], [place] [phone]   [facility] Outpatient [address], [place], [place] [phone]   [facility] [address] [place], [place]  [phone]   [facility] [address],  [place], [place] [phone] or [phone]   Substance Abuse Resources Organization         Address  Phone  Notes  [facility]  [phone]   [facility]  [phone]   [facility]  [phone]   [facility]  [phone]   Residential & Outpatient Substance Abuse Program  [phone]   Psychological Services Organization         Address  Phone  Notes  [facility]  [phone]- [phone]   [facility]  [phone]- [phone]    [facility] [address], [place] [phone] or [phone]    Mobile Crisis Teams Organization         Address  Phone  Notes  [facility], Mobile Crisis Care Unit  [phone]   [facility]  [address] [place], [place] [phone]   Doristine LocksSharon DeEsch [address] [place] [phone]    Self-Help/Support Groups Organization         Address  Phone             Notes  [facility] [place] - variety of support groups  336- [phone] Call for more information  Narcotics Anonymous (NA), [facility] [address], [place]  2 meetings at this location   Statisticianesidential Treatment Programs Organization         Address  Phone  Notes  [facility] [address],    [place]  [phone]   [facility]  [address], [place] [phone], [place], [place] [phone]   [facility] [address], [place] [phone] Admissions: 8am-3pm M-F  [facility] [address]    [place], [place] [phone] [phone]   [facility] [address], [place], [place] [phone]   [facility] [address]  [place], [place] [phone]   [facility] - Intensive Outpatient [address], [place], [place] [phone]   [facility] ([facility] [address]  [place], [place] [phone] or [phone]   [facility] ([facility]) [address] [place], [place] [phone] Accepts Medicaid  [facility] [address]  [place] [phone] Substance Abuse/Addiction Treatment   [facility] Resources Organization         Address  Phone  Notes  [facility]  [phone](888) [phone]   Angie FavaJulie Brannon, PhD [address] [place], [place]   [phone]) [phone] or [phone]) [phone]   [facility]   [address] [place], [place] [phone](336) [phone]   [facility] [address], [place], [place] [phone](336) [phone] Insurance/Medicaid/sponsorship through [facility] [address]                                    [place], [place] [phone](336) [phone] Therapy/tele-psych/case  [facility] [address]   [place], [place] [phone](336) [phone]    Dr. Lolly MustacheArfeen  [phone]   [facility]  [employer] [facility] 1) [address], [place] 2) [address], [place] 3)  [address], [place] [phone]) [phone]) [phone]  [phone](336) [phone]   [facility] Child Abuse Hotline [phone](336) [phone] or [phone](336) [phone] (After Hours)

## 2013-07-12 NOTE — ED Provider Notes (Signed)
  Medical screening examination/treatment/procedure(s) were performed by non-physician practitioner and as supervising physician I was immediately available for consultation/collaboration.      Lavonte Palos, MD 07/12/13 0721 

## 2013-09-18 ENCOUNTER — Emergency Department: Payer: Self-pay | Admitting: Emergency Medicine

## 2014-04-02 IMAGING — CR DG CHEST 2V
2 series · 2 of 2 positions shown · non-contrast
Comparison: Two-view chest x-ray 07/27/2007.

CLINICAL DATA: Abdominal pain.  Dental pain.

CHEST - 2 VIEW

[w chest pa]
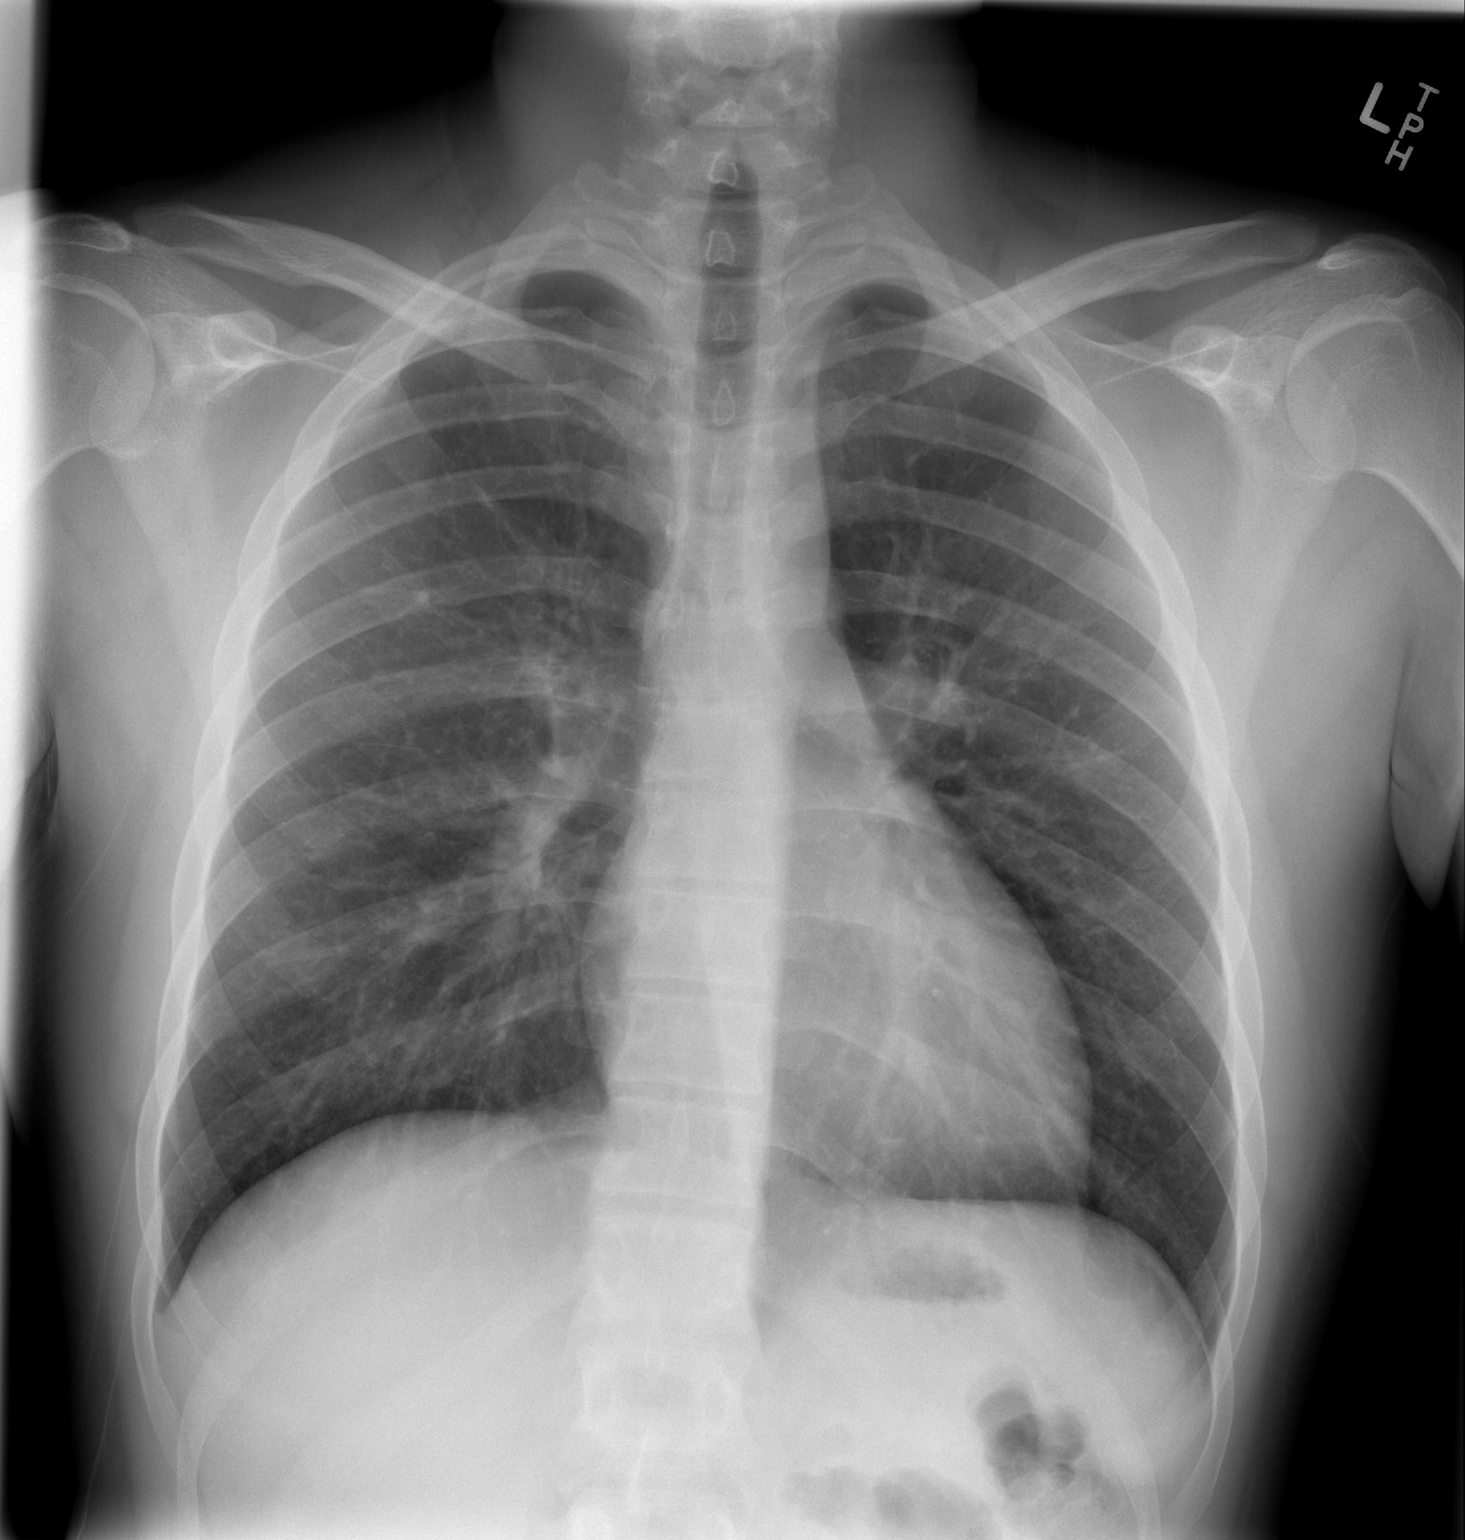

[w chest lat]
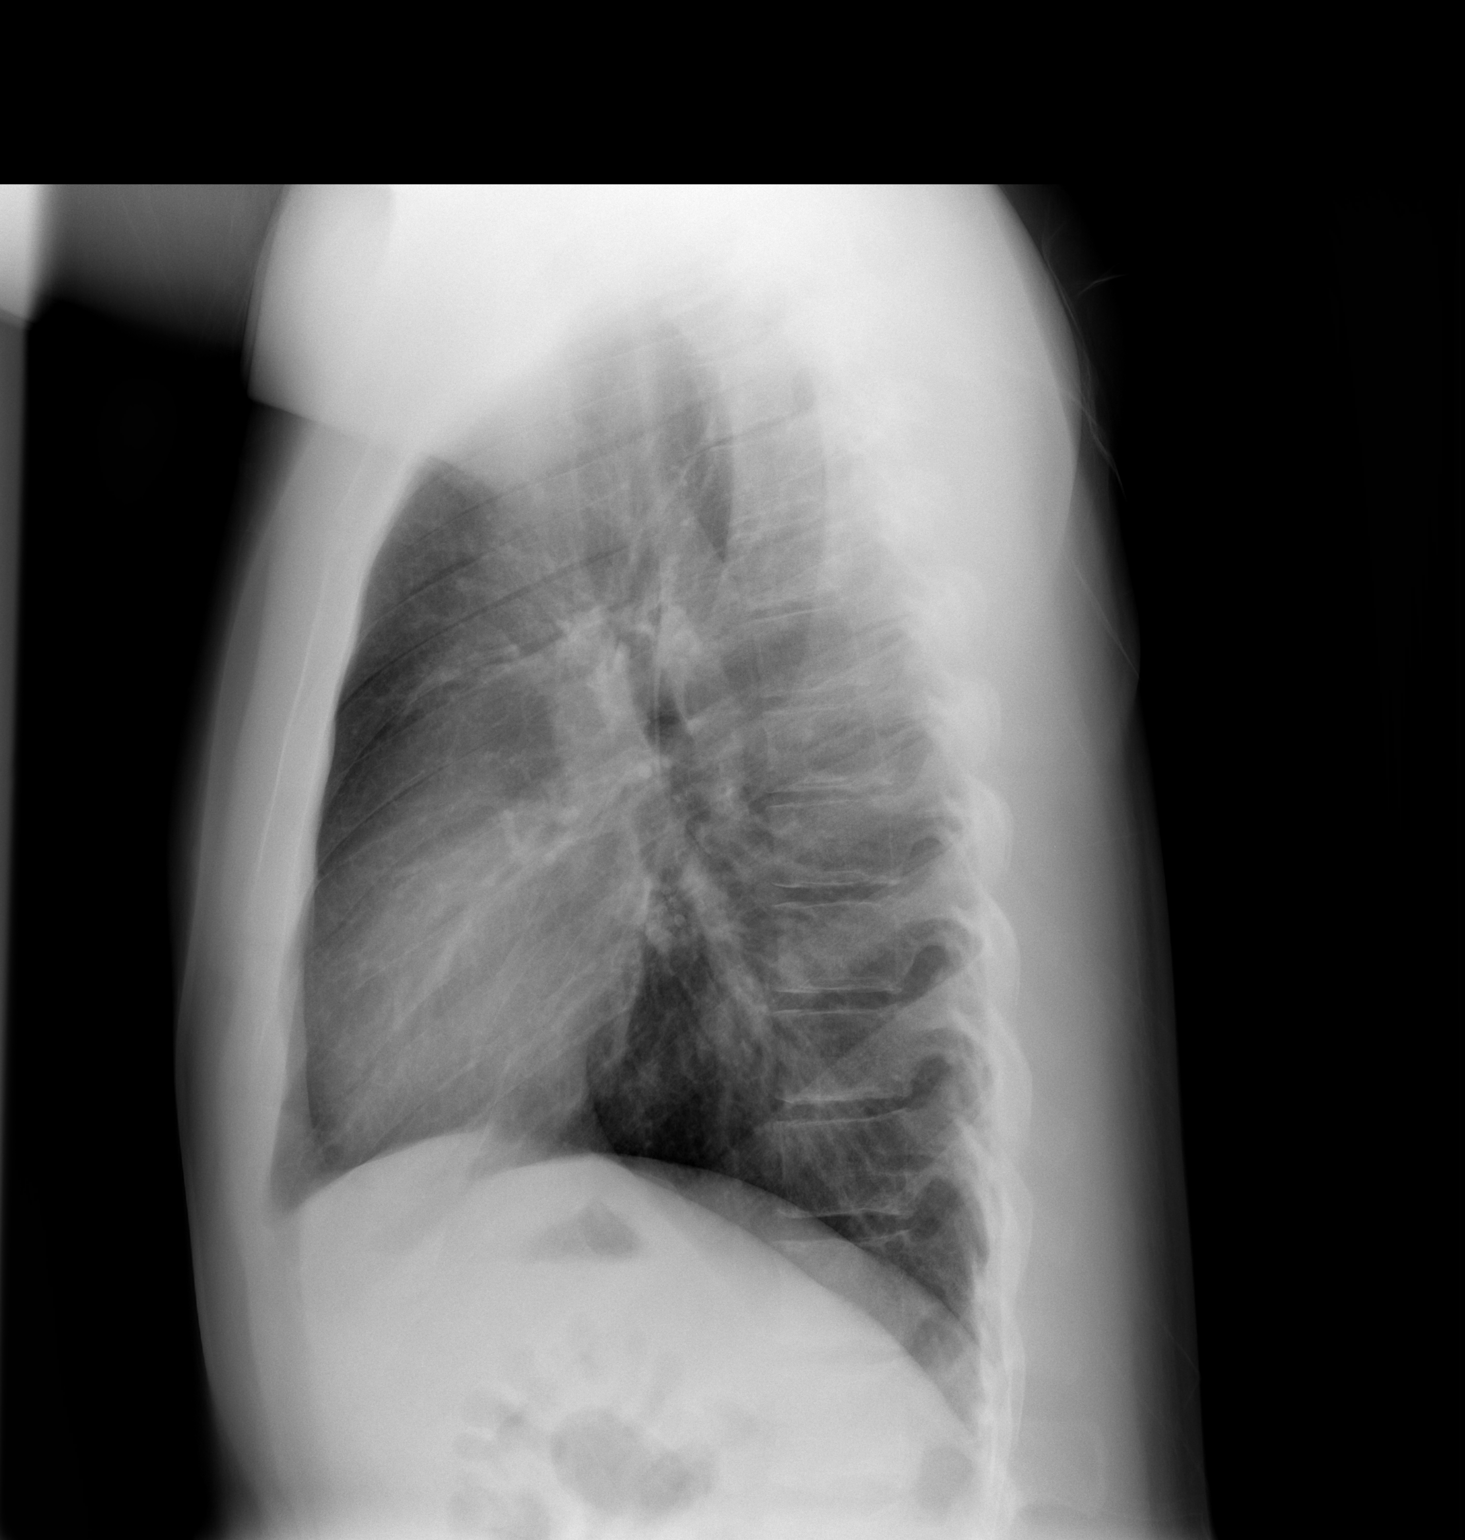

[2 of 2 positions shown; findings below may reference images not displayed]

FINDINGS: Cardiomediastinal silhouette unremarkable, unchanged.
Lungs clear.  Bronchovascular markings normal.  Pulmonary
vascularity normal.  No pneumothorax.  No pleural effusions.
Interval resolution of the opacities in the right middle and lower
lobes identified on the prior examination.  Visualized bony thorax
intact.
IMPRESSION: Normal examination.

## 2014-04-13 DIAGNOSIS — Z79899 Other long term (current) drug therapy: Secondary | ICD-10-CM | POA: Diagnosis not present

## 2014-04-13 DIAGNOSIS — Z880 Allergy status to penicillin: Secondary | ICD-10-CM | POA: Insufficient documentation

## 2014-04-13 DIAGNOSIS — Y990 Civilian activity done for income or pay: Secondary | ICD-10-CM | POA: Insufficient documentation

## 2014-04-13 DIAGNOSIS — T543X1A Toxic effect of corrosive alkalis and alkali-like substances, accidental (unintentional), initial encounter: Secondary | ICD-10-CM | POA: Diagnosis not present

## 2014-04-13 DIAGNOSIS — S0591XA Unspecified injury of right eye and orbit, initial encounter: Secondary | ICD-10-CM | POA: Diagnosis present

## 2014-04-13 DIAGNOSIS — X58XXXA Exposure to other specified factors, initial encounter: Secondary | ICD-10-CM | POA: Insufficient documentation

## 2014-04-13 DIAGNOSIS — J45909 Unspecified asthma, uncomplicated: Secondary | ICD-10-CM | POA: Insufficient documentation

## 2014-04-13 DIAGNOSIS — Z720 Tobacco use: Secondary | ICD-10-CM | POA: Insufficient documentation

## 2014-04-13 DIAGNOSIS — Y9289 Other specified places as the place of occurrence of the external cause: Secondary | ICD-10-CM | POA: Diagnosis not present

## 2014-04-13 DIAGNOSIS — H11421 Conjunctival edema, right eye: Secondary | ICD-10-CM | POA: Diagnosis not present

## 2014-04-13 DIAGNOSIS — Y9389 Activity, other specified: Secondary | ICD-10-CM | POA: Diagnosis not present

## 2014-04-13 DIAGNOSIS — T2661XA Corrosion of cornea and conjunctival sac, right eye, initial encounter: Secondary | ICD-10-CM | POA: Insufficient documentation

## 2014-04-14 ENCOUNTER — Emergency Department (HOSPITAL_COMMUNITY)
Admission: EM | Admit: 2014-04-14 | Discharge: 2014-04-14 | Disposition: A | Discharge status: Home / Self Care | Payer: Worker's Compensation | Attending: Emergency Medicine | Admitting: Emergency Medicine

## 2014-04-14 ENCOUNTER — Encounter (HOSPITAL_COMMUNITY): Payer: Self-pay | Admitting: Emergency Medicine

## 2014-04-14 DIAGNOSIS — T2661XA Corrosion of cornea and conjunctival sac, right eye, initial encounter: Secondary | ICD-10-CM

## 2014-04-14 DIAGNOSIS — T543X1A Toxic effect of corrosive alkalis and alkali-like substances, accidental (unintentional), initial encounter: Secondary | ICD-10-CM

## 2014-04-14 MED ORDER — PROPARACAINE HCL 0.5 % OP SOLN
1.0000 [drp] | Freq: Once | OPHTHALMIC | Status: AC
  Administered 2014-04-14: 1 [drp] via OPHTHALMIC
  Filled 2014-04-14: qty 15

## 2014-04-14 MED ORDER — OXYCODONE-ACETAMINOPHEN 5-325 MG PO TABS: 2.0000 | ORAL_TABLET | ORAL | Status: DC | PRN

## 2014-04-14 MED ORDER — OXYCODONE-ACETAMINOPHEN 5-325 MG PO TABS
2.0000 | ORAL_TABLET | Freq: Once | ORAL | Status: AC
  Administered 2014-04-14: 2 via ORAL
  Filled 2014-04-14: qty 2

## 2014-04-14 MED ORDER — ONDANSETRON 4 MG PO TBDP
4.0000 mg | ORAL_TABLET | Freq: Once | ORAL | Status: AC
  Administered 2014-04-14: 4 mg via ORAL
  Filled 2014-04-14: qty 1

## 2014-04-14 MED ORDER — HYDROMORPHONE HCL 1 MG/ML IJ SOLN
2.0000 mg | Freq: Once | INTRAMUSCULAR | Status: AC
  Administered 2014-04-14: 2 mg via INTRAMUSCULAR
  Filled 2014-04-14: qty 2

## 2014-04-14 NOTE — ED Notes (Signed)
RN remains at bedside flushing pt right eye with normal saline per MD instructions.  After 2 liters of NS morgan lens was inserted and 3rd liter of NS flush initiated.

## 2014-04-14 NOTE — ED Notes (Signed)
Pt. accidentally sprayed chemical  degreaser at right eye this evening at work , presents with right eye redness /drainage and blurred vision .

## 2014-04-14 NOTE — Discharge Instructions (Signed)
Go to Dr. Randon GoldsmithLyles office at 8:30AM.  Chemical Conjunctivitis Chemical conjunctivitis is an irritation of the underside of the eyelid and the white part of the eye. Conjunctivitis can be caused by infection, allergy or chemical irritation. In your case it has been caused by a chemical irritation of the eye. Symptoms almost always include: tearing, light sensitivity, gritty feeling (sensation) in the eyes, swelling of your eyelids, and often severe pain. In spite of the severe pain, this irritation will run its course and will improve within 24 hours.  HOME CARE INSTRUCTIONS   To ease discomfort apply a cool, clean wash cloth to your eye for 10 to 20 minutes, 3 to 4 times per day.  Do not rub your eyes.  Gently wipe away any discharge from the eyes with moistened tissues.  Wash your hands often with soap and use paper towels to dry.  Sunglasses may be helpful if light bothers your eyes.  Do not use eye make-up.  Do not use contact lenses until the irritation is gone.  Do not operate machinery or drive if your vision is blurred.  Take medications as directed by your caregiver. Artificial tears may ease discomfort.  Avoid the chemical or surroundings which caused the problem. Always use eye protection as necessary. SEEK MEDICAL CARE IF:   The eye is still pink (inflamed) 3 days after beginning treatment.  Pain in the eye increases.  You have discharge coming from either eye.  Your eyelids are stuck together in the morning.  You have an increased sensitivity to light.  An oral temperature above 102 F (38.9 C) develops.  You develop facial pain.  You have any problems that may be related to the medicine you are taking. SEEK IMMEDIATE MEDICAL CARE IF:   Your vision is getting worse.  You develop severe eye pain. MAKE SURE YOU:   Understand these instructions.  Will watch your condition.  Will get help right away if you are not doing well or get worse. Document Released:  02/27/2005 Document Revised: 08/12/2011 Document Reviewed: 01/06/2008 [facility] Patient Information 2015 [employer] [place]. This information is not intended to replace advice given to you by your health care provider. Make sure you discuss any questions you have with your health care provider.
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
## 2014-04-16 ENCOUNTER — Emergency Department (HOSPITAL_COMMUNITY)
Admission: EM | Admit: 2014-04-16 | Discharge: 2014-04-16 | Disposition: A | Discharge status: Home / Self Care | Payer: Worker's Compensation | Attending: Emergency Medicine | Admitting: Emergency Medicine

## 2014-04-16 ENCOUNTER — Encounter (HOSPITAL_COMMUNITY): Payer: Self-pay

## 2014-04-16 DIAGNOSIS — T2611XD Burn of cornea and conjunctival sac, right eye, subsequent encounter: Secondary | ICD-10-CM | POA: Diagnosis present

## 2014-04-16 DIAGNOSIS — Y998 Other external cause status: Secondary | ICD-10-CM | POA: Insufficient documentation

## 2014-04-16 DIAGNOSIS — Z880 Allergy status to penicillin: Secondary | ICD-10-CM | POA: Diagnosis not present

## 2014-04-16 DIAGNOSIS — Y9289 Other specified places as the place of occurrence of the external cause: Secondary | ICD-10-CM | POA: Insufficient documentation

## 2014-04-16 DIAGNOSIS — X12XXXD Contact with other hot fluids, subsequent encounter: Secondary | ICD-10-CM | POA: Insufficient documentation

## 2014-04-16 DIAGNOSIS — Y9389 Activity, other specified: Secondary | ICD-10-CM | POA: Insufficient documentation

## 2014-04-16 DIAGNOSIS — J45909 Unspecified asthma, uncomplicated: Secondary | ICD-10-CM | POA: Insufficient documentation

## 2014-04-16 DIAGNOSIS — T304 Corrosion of unspecified body region, unspecified degree: Secondary | ICD-10-CM

## 2014-04-16 DIAGNOSIS — Z79899 Other long term (current) drug therapy: Secondary | ICD-10-CM | POA: Diagnosis not present

## 2014-04-16 DIAGNOSIS — Z720 Tobacco use: Secondary | ICD-10-CM | POA: Diagnosis not present

## 2014-04-16 DIAGNOSIS — T2611XA Burn of cornea and conjunctival sac, right eye, initial encounter: Secondary | ICD-10-CM | POA: Insufficient documentation

## 2014-04-16 MED ORDER — OXYCODONE-ACETAMINOPHEN 5-325 MG PO TABS: 1.0000 | ORAL_TABLET | ORAL | Status: AC | PRN

## 2014-04-16 MED ORDER — HYDROMORPHONE HCL 1 MG/ML IJ SOLN
1.0000 mg | Freq: Once | INTRAMUSCULAR | Status: AC
  Administered 2014-04-16: 1 mg via INTRAMUSCULAR
  Filled 2014-04-16: qty 1

## 2014-04-16 MED ORDER — ONDANSETRON 4 MG PO TBDP
4.0000 mg | ORAL_TABLET | Freq: Once | ORAL | Status: AC
  Administered 2014-04-16: 4 mg via ORAL
  Filled 2014-04-16: qty 1
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
## 2014-04-16 NOTE — Discharge Instructions (Signed)
As discussed.  I spoke with Dr. Ranae PilaLiles who wanted you to make sure that the Systane drops that you're using R preservative-free the ones I checked, NED are not these need to be discarded and you need to purchase preservative free lubricating eyedrops.  The ointment that you have is appropriate.  Please continue the bacitracin ointment as well as the steroid.  He would like to see you in the office on Monday.  Please call first thing in morning to set a time. You have been given a prescription for more pain medicine.  Please take this as needed
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
